# Patient Record
Sex: Female | Born: 1940 | Race: White | Hispanic: No | Smoking: Former smoker
Health system: Southern US, Community
[De-identification: ages and names within clinical notes are randomized; demographics above are authoritative.]

## PROBLEM LIST (undated history)

## (undated) DIAGNOSIS — I10 Essential (primary) hypertension: Secondary | ICD-10-CM

## (undated) DIAGNOSIS — G35 Multiple sclerosis: Secondary | ICD-10-CM

## (undated) HISTORY — PX: ABDOMINAL HYSTERECTOMY: SHX81

---

## 2016-12-30 ENCOUNTER — Encounter (HOSPITAL_COMMUNITY): Payer: Self-pay | Admitting: Emergency Medicine

## 2016-12-30 ENCOUNTER — Emergency Department (HOSPITAL_COMMUNITY)
Admission: EM | Admit: 2016-12-30 | Discharge: 2016-12-31 | Disposition: A | Discharge status: Home / Self Care | Payer: Medicare Other | Attending: Emergency Medicine | Admitting: Emergency Medicine

## 2016-12-30 DIAGNOSIS — S0001XA Abrasion of scalp, initial encounter: Secondary | ICD-10-CM | POA: Diagnosis not present

## 2016-12-30 DIAGNOSIS — S0990XA Unspecified injury of head, initial encounter: Secondary | ICD-10-CM | POA: Insufficient documentation

## 2016-12-30 DIAGNOSIS — W01198A Fall on same level from slipping, tripping and stumbling with subsequent striking against other object, initial encounter: Secondary | ICD-10-CM | POA: Diagnosis not present

## 2016-12-30 DIAGNOSIS — M25562 Pain in left knee: Secondary | ICD-10-CM | POA: Diagnosis not present

## 2016-12-30 DIAGNOSIS — M25561 Pain in right knee: Secondary | ICD-10-CM | POA: Insufficient documentation

## 2016-12-30 DIAGNOSIS — Z7902 Long term (current) use of antithrombotics/antiplatelets: Secondary | ICD-10-CM | POA: Insufficient documentation

## 2016-12-30 DIAGNOSIS — Z79899 Other long term (current) drug therapy: Secondary | ICD-10-CM | POA: Insufficient documentation

## 2016-12-30 DIAGNOSIS — T1490XA Injury, unspecified, initial encounter: Secondary | ICD-10-CM

## 2016-12-30 DIAGNOSIS — Y999 Unspecified external cause status: Secondary | ICD-10-CM | POA: Diagnosis not present

## 2016-12-30 DIAGNOSIS — Y939 Activity, unspecified: Secondary | ICD-10-CM | POA: Insufficient documentation

## 2016-12-30 DIAGNOSIS — Y929 Unspecified place or not applicable: Secondary | ICD-10-CM | POA: Insufficient documentation

## 2016-12-30 DIAGNOSIS — Z7982 Long term (current) use of aspirin: Secondary | ICD-10-CM | POA: Diagnosis not present

## 2016-12-30 DIAGNOSIS — I10 Essential (primary) hypertension: Secondary | ICD-10-CM | POA: Diagnosis not present

## 2016-12-30 DIAGNOSIS — Z87891 Personal history of nicotine dependence: Secondary | ICD-10-CM | POA: Insufficient documentation

## 2016-12-30 DIAGNOSIS — W19XXXA Unspecified fall, initial encounter: Secondary | ICD-10-CM

## 2016-12-30 HISTORY — DX: Multiple sclerosis: G35

## 2016-12-30 HISTORY — DX: Essential (primary) hypertension: I10

## 2016-12-30 MED ORDER — LISINOPRIL 20 MG PO TABS
40.0000 mg | ORAL_TABLET | Freq: Once | ORAL | Status: AC
  Administered 2016-12-31: 40 mg via ORAL
  Filled 2016-12-30: qty 2
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
## 2016-12-30 NOTE — ED Triage Notes (Signed)
Patient fell today around 1500, lost her balance. Multiple falls this month.

## 2016-12-31 ENCOUNTER — Emergency Department (HOSPITAL_COMMUNITY): Payer: Medicare Other

## 2016-12-31 DIAGNOSIS — S0990XA Unspecified injury of head, initial encounter: Secondary | ICD-10-CM | POA: Diagnosis not present

## 2016-12-31 MED ORDER — PANTOPRAZOLE SODIUM 40 MG PO TBEC: 40.0000 mg | DELAYED_RELEASE_TABLET | Freq: Every day | ORAL | 0 refills | Status: AC

## 2016-12-31 MED ORDER — FLUOCINONIDE 0.05 % EX SOLN: 1.0000 "application " | Freq: Every day | CUTANEOUS | 0 refills | Status: AC

## 2016-12-31 MED ORDER — PRAVASTATIN SODIUM 10 MG PO TABS: 10.0000 mg | ORAL_TABLET | Freq: Every day | ORAL | 0 refills | Status: AC

## 2016-12-31 MED ORDER — FLUOCINONIDE 0.05 % EX SOLN: 1.0000 "application " | Freq: Two times a day (BID) | CUTANEOUS | 0 refills | Status: DC

## 2016-12-31 NOTE — ED Notes (Signed)
Pt has hx of MS; reports frequent falls. Today, pt fell and hit her head on a hub of the car. Small superficial lac noted to l posterior head. No bleeding at present. Pt also c/o bilateral knee pain, R worsens with movement and L hip pain

## 2016-12-31 NOTE — Discharge Instructions (Signed)
There were no acute abnormalities noted on imaging studies.  Pain: Take 400 mg of ibuprofen every 6 hours for the next 3 days. After this time, this medication may be used as needed for pain. Take this medication with food to avoid upset stomach.  Tylenol: Should you continue to have additional pain while taking the ibuprofen or naproxen, you may add in tylenol as needed. Your daily total maximum amount of tylenol from all sources should be limited to /day for persons without liver problems, or /day for those with liver problems. Ice: May apply ice to the area over the next 24 hours for 15 minutes at a time to reduce swelling. Elevation: Keep the extremity elevated as often as possible to reduce pain and inflammation. Support: Wear the knee sleeve for support and comfort. Wear this until pain resolves.  Exercises: Start by performing these exercises a few times a week, increasing the frequency until you are performing them twice daily.  Follow up: If symptoms are improving, you may follow up with your primary care provider for any continued management. If symptoms are not improving, you may follow up with the orthopedic specialist.  Head Injury You have been seen today for a head injury. It does not appear to be serious at this time.  Close observation: The close observation period is usually 6 hours from the injury. This includes staying awake and having a trustworthy adult monitor you to assure your condition does not worsen. You should be in regular contact with this person and ideally, they should be able to monitor you in person.  Secondary observation: The secondary observation period is usually 24 hours from the injury. You are allowed to sleep during this time. A trustworthy adult should intermittently monitor you to assure your condition does not worsen.   Overall head injury/concussion care: Rest: Be sure to get plenty of rest. You will need more rest and sleep while you  recover. Hydration: Be sure to stay well hydrated by having a goal of drinking about 0.5 liters of water an hour. Pain: Please see directions above.  Return to sports and activities: In general, you may return to normal activities once symptoms have subsided, however, you would ideally be cleared by a primary care provider or other qualified medical professional prior to return to these activities.  Follow up: Follow up with the concussion clinic or your primary care provider for further management of this issue. Return: Return to the ED should any symptoms worsen.

## 2018-10-13 IMAGING — CT CT HEAD W/O CM
4 of 6 series · 16 of 47 positions shown, 18 images · non-contrast
Comparison: None.

CLINICAL DATA: Fall in parking lot striking head.  Head laceration.

EXAM:
CT HEAD WITHOUT CONTRAST
CT CERVICAL SPINE WITHOUT CONTRAST
TECHNIQUE: Multidetector CT imaging of the head and cervical spine was
performed following the standard protocol without intravenous
contrast. Multiplanar CT image reconstructions of the cervical spine
were also generated.

[Series 4: head 5.0 h30s · axial · 0.44mm/px · z∈[-59,+61]mm · 7 of 33 slices shown, 9 images]
[im 5/33  brain]
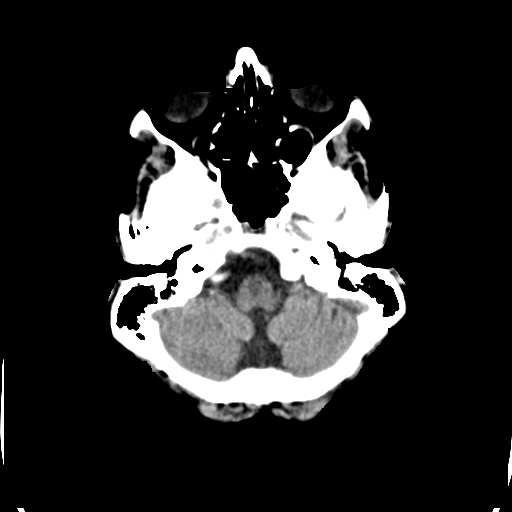
[im 5/33  bone]
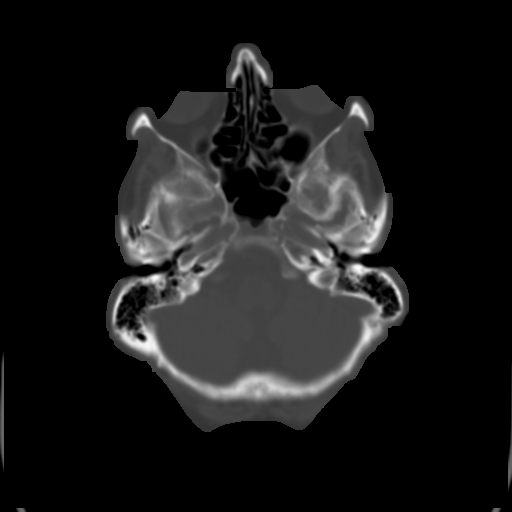
[im 9/33  brain]
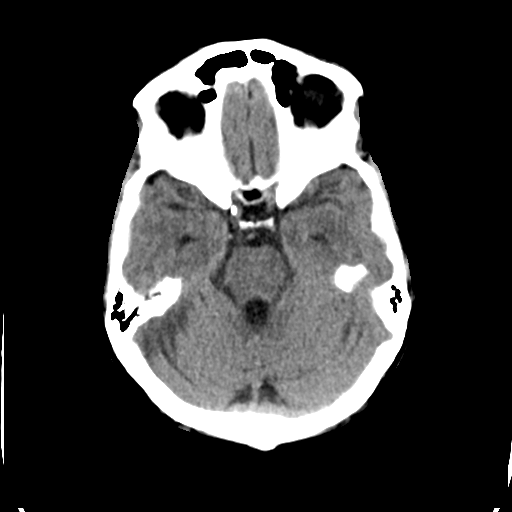
[im 13/33  brain]
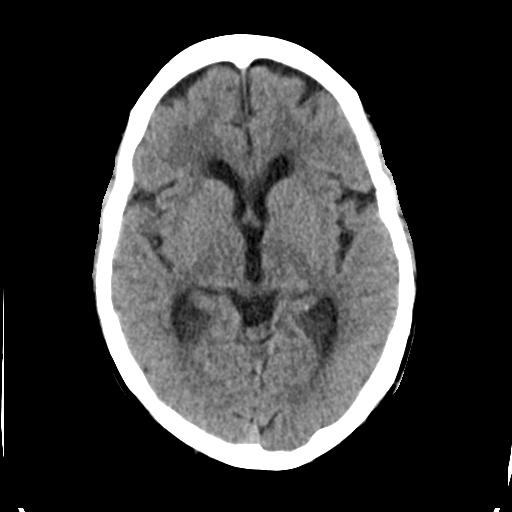
[im 17/33  brain]
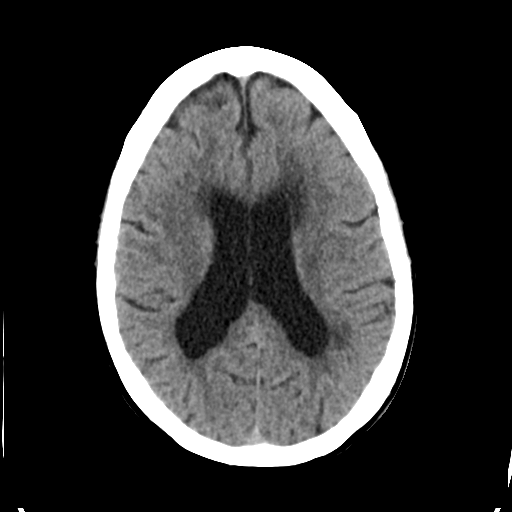
[im 21/33  brain]
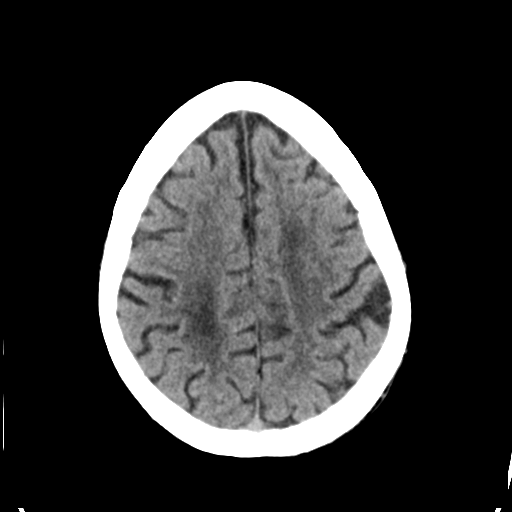
[im 21/33  bone]
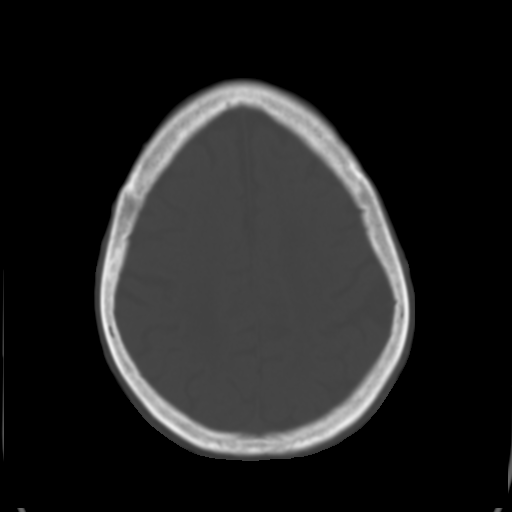
[im 25/33  brain]
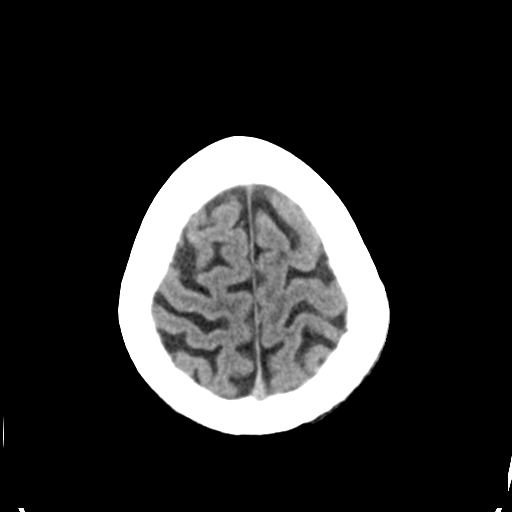
[im 29/33  brain]
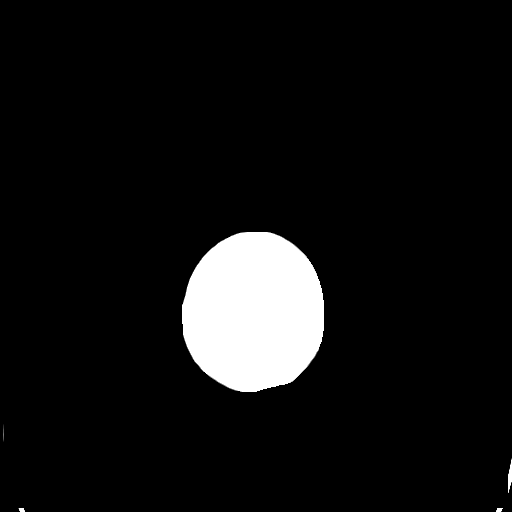

[Series 6: head 3.0 mpr cor · coronal · 0.31mm/px · 3 of 67 slices shown]
[im 17/67  brain]
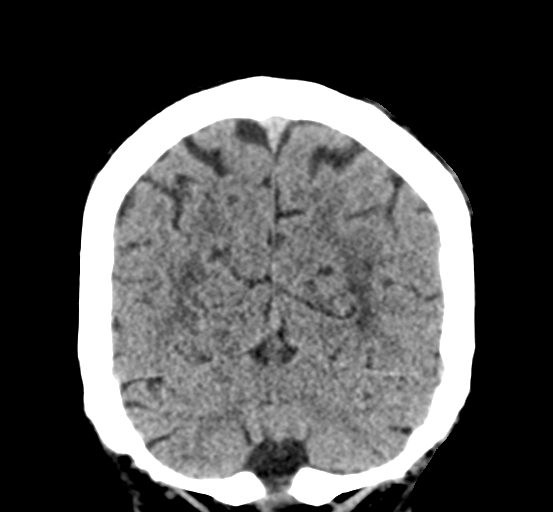
[im 34/67  brain]
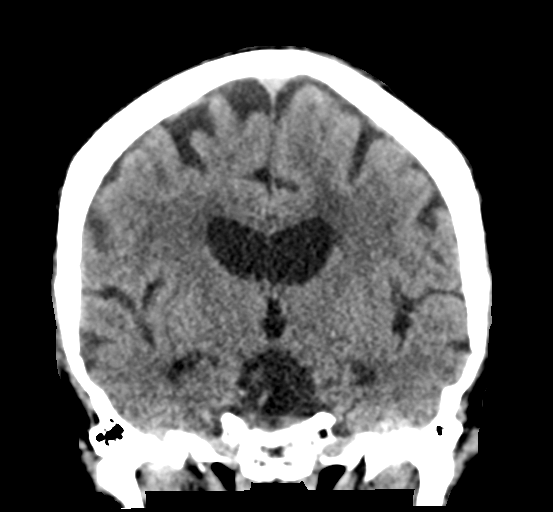
[im 50/67  brain]
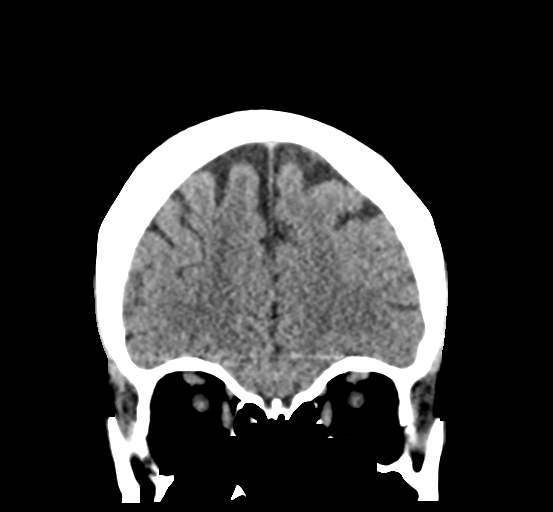

[Series 7: head 3.0 mpr sag · sagittal · 0.31mm/px · 1 of 55 slices shown]
[im 28/55  brain]
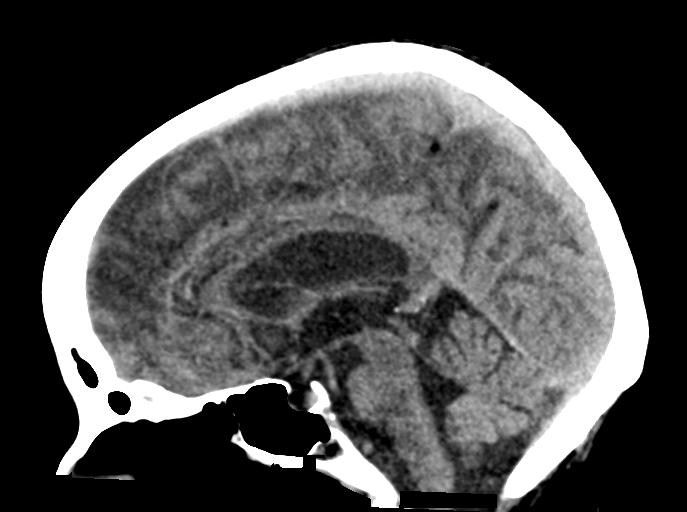

[Series 9: c_spine 2.0 st · axial · 0.29mm/px · z∈[-199,-131]mm · 5 of 76 slices shown]
[im 8/76  brain]
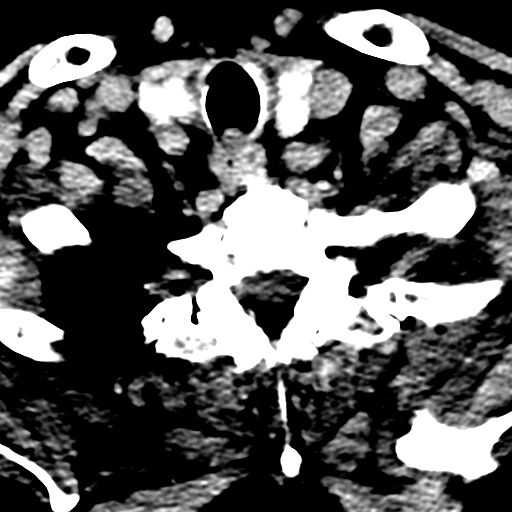
[im 16/76  brain]
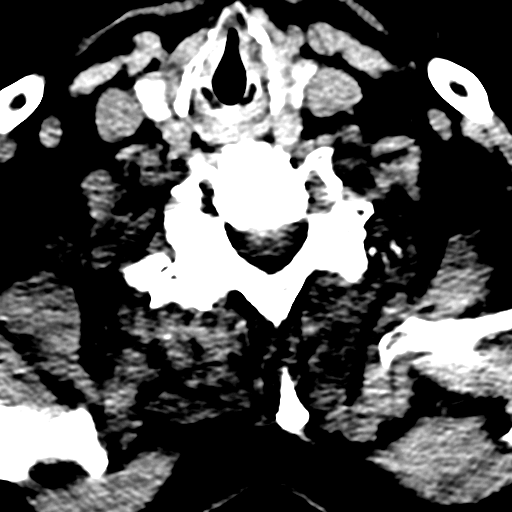
[im 23/76  brain]
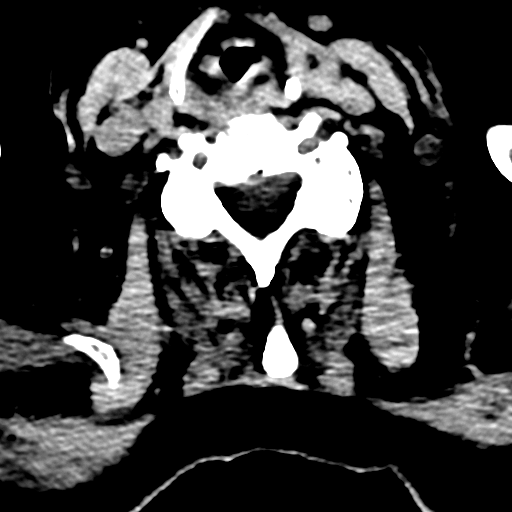
[im 34/76  brain]
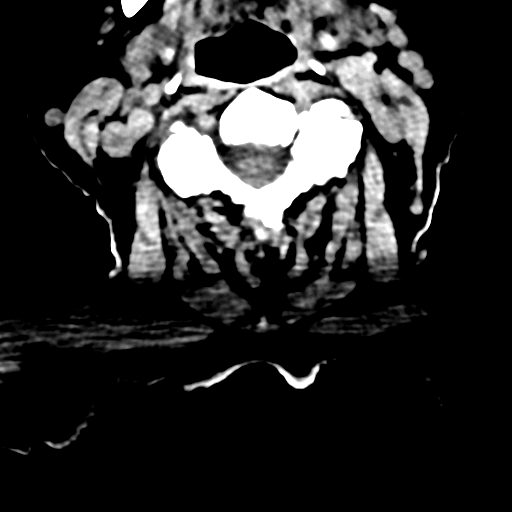
[im 42/76  brain]
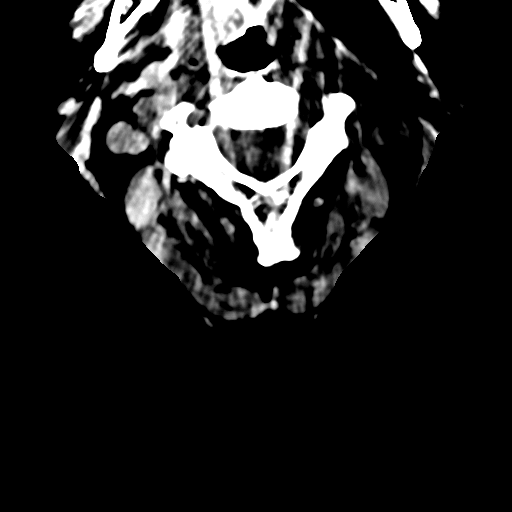

[16 of 47 positions shown; findings below may reference images not displayed]

FINDINGS: CT HEAD FINDINGS

Brain: Moderate generalized atrophy. Moderate to advanced
periventricular and deep white matter chronic small vessel ischemia.
No intracranial hemorrhage, mass effect, or midline shift. No
hydrocephalus. The basilar cisterns are patent. No evidence of
territorial infarct or acute ischemia. No extra-axial or
intracranial fluid collection.

Vascular: Atherosclerosis of skullbase vasculature without
hyperdense vessel or abnormal calcification.

Skull: No fracture or focal lesion.

Sinuses/Orbits: Paranasal sinuses and mastoid air cells are clear.
The visualized orbits are unremarkable. Bilateral cataract
resection.

Other: None.

CT CERVICAL SPINE FINDINGS

Alignment: No traumatic subluxation. Minimal anterolisthesis of C7
on T1 is degenerative related to facet disease.

Skull base and vertebrae: No acute fracture. Vertebral body heights
are maintained. The dens and skull base are intact.

Soft tissues and spinal canal: No prevertebral fluid or swelling. No
visible canal hematoma.

Disc levels: Diffuse disc space narrowing and endplate spurring,
most prominent at C5-C6 and C6-C7. There is multilevel facet
arthropathy. Multilevel neural foraminal stenosis.

Upper chest: Negative.

Other: None.
IMPRESSION: 1. No acute intracranial abnormality. No skull fracture. Generalized
atrophy and moderate to advanced chronic small vessel ischemia.
2. Multilevel degenerative change in the cervical spine without
acute fracture or subluxation.
